# Patient Record
Sex: Male | Born: 2000 | Race: White | Hispanic: No | Marital: Single | Smoking: Never smoker
Health system: Southern US, Community
[De-identification: ages and names within clinical notes are randomized; demographics above are authoritative.]

## PROBLEM LIST (undated history)

## (undated) DIAGNOSIS — R519 Headache, unspecified: Secondary | ICD-10-CM

## (undated) DIAGNOSIS — F909 Attention-deficit hyperactivity disorder, unspecified type: Secondary | ICD-10-CM

## (undated) DIAGNOSIS — S82892D Other fracture of left lower leg, subsequent encounter for closed fracture with routine healing: Secondary | ICD-10-CM

## (undated) DIAGNOSIS — F429 Obsessive-compulsive disorder, unspecified: Secondary | ICD-10-CM

## (undated) DIAGNOSIS — K219 Gastro-esophageal reflux disease without esophagitis: Secondary | ICD-10-CM

## (undated) HISTORY — DX: Headache, unspecified: R51.9

## (undated) HISTORY — DX: Gastro-esophageal reflux disease without esophagitis: K21.9

## (undated) HISTORY — DX: Obsessive-compulsive disorder, unspecified: F42.9

## (undated) HISTORY — DX: Other fracture of left lower leg, subsequent encounter for closed fracture with routine healing: S82.892D

## (undated) HISTORY — DX: Attention-deficit hyperactivity disorder, unspecified type: F90.9

---
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
## 2021-01-19 ENCOUNTER — Other Ambulatory Visit: Payer: Self-pay | Admitting: Behavioral Health

## 2021-01-19 ENCOUNTER — Telehealth: Payer: Self-pay | Admitting: Behavioral Health

## 2021-01-19 NOTE — Progress Notes (Signed)
Patients mother called and spoke to staff advising that they self increased dosage of Intuniv to 5 mg from left over script. Patient advised that  Intuniv 4 mg ER is max dosage for this medication.  Patient should not ever increase dosage of medication without consulting first. Patient is scheduled for follow up July.

## 2021-01-19 NOTE — Telephone Encounter (Signed)
Please inform her that is not possible. 4 mg is max dosage for that medication and higher doses could not be safe. She should not be increasing dose without consulting.  We will have discuss next appt.

## 2021-01-19 NOTE — Telephone Encounter (Signed)
Pt's mom Harriett Sine called to report Pt was taking guanfacine 4 mg and wasn't quite enough. Had some of lesser dose from previous Rx. Pt took total of 5 mg and worked well. Requesting to send in new Rx for 5 mg to [facility]. Follow up apt is 7/19. Contact # [phone]   Pt continues to take Trazodone 50 mg @ night.

## 2021-01-20 NOTE — Telephone Encounter (Signed)
Called Pt's mom Harriett Sine back. LM on VM to return call to discuss information from BW

## 2021-01-21 NOTE — Telephone Encounter (Signed)
Harriett Sine returned call. Explained instructions about 4 mg is max dose. Higher dosed could not be safe . She understood and stated he will go back to 4 mg and discuss @ apt 7/19

## 2021-01-26 ENCOUNTER — Other Ambulatory Visit: Payer: Self-pay

## 2021-01-26 DIAGNOSIS — F902 Attention-deficit hyperactivity disorder, combined type: Secondary | ICD-10-CM

## 2021-01-26 MED ORDER — BUPROPION HCL ER (XL) 150 MG PO TB24: 150.0000 mg | ORAL_TABLET | Freq: Every day | ORAL | 0 refills | Status: DC

## 2021-02-01 ENCOUNTER — Other Ambulatory Visit: Payer: Self-pay

## 2021-02-01 ENCOUNTER — Encounter: Payer: Self-pay | Admitting: Behavioral Health

## 2021-02-01 ENCOUNTER — Ambulatory Visit (INDEPENDENT_AMBULATORY_CARE_PROVIDER_SITE_OTHER): Payer: 59 | Admitting: Behavioral Health

## 2021-02-01 VITALS — Wt 137.0 lb

## 2021-02-01 DIAGNOSIS — G4700 Insomnia, unspecified: Secondary | ICD-10-CM

## 2021-02-01 DIAGNOSIS — F411 Generalized anxiety disorder: Secondary | ICD-10-CM

## 2021-02-01 DIAGNOSIS — F902 Attention-deficit hyperactivity disorder, combined type: Secondary | ICD-10-CM

## 2021-02-01 DIAGNOSIS — F428 Other obsessive-compulsive disorder: Secondary | ICD-10-CM | POA: Diagnosis not present

## 2021-02-01 MED ORDER — MIRTAZAPINE 30 MG PO TABS: 30.0000 mg | ORAL_TABLET | Freq: Every day | ORAL | 1 refills | Status: DC

## 2021-02-01 MED ORDER — BUSPIRONE HCL 15 MG PO TABS: ORAL_TABLET | ORAL | 1 refills | Status: DC

## 2021-02-01 NOTE — Progress Notes (Signed)
[facility]  Patient ID: Marquell Saenz,  MRN: [id number]  PCP: Mickie Bail, MD  Date of Evaluation: 02/01/2021 Time spent:30 minutes  Chief Complaint:  Chief Complaint   Anxiety; Depression; ADHD; Follow-up; Medication Problem; Medication Refill     HISTORY/CURRENT STATUS: HPI 20 year old male patient presents to this office for follow up and medication management. His mother is present with his consent. He says that he feels like he has improved but also realized that he was blaming his anxiety on just ADHD. Says Wellbutrin has helped with anxiety and depression overall but he still struggles some. He says that he has been supplementing some Cannabis use to help with anxiety but realizes it may also work against him. He says that the Trazodone did not help much with sleep and so he smokes weed to go to sleep. Says he continues to lose weight and thinks it might be the type of weed he is using. He says that he wants help with anxiety so he can start cutting back on the Cannabis. He says his anxiety today is 6/10 and depression is 3/10.  He is getting 6-7 hours sleep broken sleep per night.  No mania, no psychosis. No SI/HI. Past medication failures: Concerta 27 mg and Ritalin 10 mg R tablets with possibly Focalin 15 mg ER  Individual Medical History/ Review of Systems: Changes? :No   Allergies: Patient has no known allergies.  Current Medications:  Current Outpatient Medications:    busPIRone (BUSPAR) 15 MG tablet, Take 1/3 tablet p.o. twice daily for 1 week, then take 2/3 tablet p.o. twice daily for 1 week, then take 1 tablet p.o. twice daily, Disp: 60 tablet, Rfl: 1   mirtazapine (REMERON) 30 MG tablet, Take 1 tablet (30 mg total) by mouth at bedtime., Disp: 30 tablet, Rfl: 1   buPROPion (WELLBUTRIN XL) 150 MG 24 hr tablet, Take 1 tablet (150 mg total) by mouth daily., Disp: 90 tablet, Rfl: 0   guanFACINE (INTUNIV) 4 MG TB24 ER tablet, Take 1 tablet (4 mg total) by  mouth daily., Disp: 30 tablet, Rfl: 1   GuanFACINE HCl 3 MG TB24, Take 1 tablet (3 mg total) by mouth at bedtime., Disp: 90 tablet, Rfl: 3 Medication Side Effects: none  Family Medical/ Social History: Changes? No  MENTAL HEALTH EXAM:  Weight 137 lb (62.1 kg).Body mass index is 18.07 kg/m.  General Appearance: Casual, Neat, and Well Groomed  Eye Contact:  Fair  Speech:  Clear and Coherent  Volume:  Normal  Mood:  Anxious  Affect:  Appropriate  Thought Process:  Coherent  Orientation:  Full (Time, Place, and Person)  Thought Content: Logical   Suicidal Thoughts:  No  Homicidal Thoughts:  No  Memory:  WNL  Judgement:  Fair  Insight:  Fair  Psychomotor Activity:  Increased  Concentration:  Concentration: Fair  Recall:  Fair  Fund of Knowledge: Good  Language: Good  Assets:  Desire for Improvement Physical Health  ADL's:  Intact  Cognition: WNL  Prognosis:  Good    DIAGNOSES:    ICD-10-CM   1. Attention deficit hyperactivity disorder (ADHD), combined type, moderate  F90.2 mirtazapine (REMERON) 30 MG tablet    2. Insomnia, unspecified type  G47.00 mirtazapine (REMERON) 30 MG tablet    3. Generalized anxiety disorder  F41.1 mirtazapine (REMERON) 30 MG tablet    busPIRone (BUSPAR) 15 MG tablet    4. Other obsessive-compulsive disorders  F42.8 mirtazapine (REMERON) 30 MG tablet  Receiving Psychotherapy: No    RECOMMENDATIONS:    Will continue Guanfacine 4 mg ER daily Discontinue Trazodone 50 mg at bedtime for sleep To start Remeron 30 mg tablet at bedtime for sleep and to assist in increasing appetite. Continue Wellbutrin 150 mg XL daily Start BuSpar 15 mg 1/3 tablet twice daily for 1 week, then increase to 2/3 tablet twice daily for 1 week, then increase to 1 tablet twice daily for anxiety.  To report any worsening symptoms and side effects promptly Agreed to follow up in 4 weeks to reassess. Greater than 50% of 30 min. face to face time with patient was  spent on counseling and coordination of care. We discussed taking medications properly and not skipping doses. Discussed patients continued weight loss and loss of appetite. We discussed possible unknown medication interference with Delta 8 and lacking research on the subject. Patient has recently switched back to regular Cannabis. Discussed with patient Cannabis being possible trigger for increased anxiety and irritability. Educated patient on how to organize medication to best remember taking on time. Instructed patient to check weights weekly.  E scribed medication to pharmacy.     Joan Flores, NP

## 2021-02-10 ENCOUNTER — Telehealth: Payer: Self-pay | Admitting: Behavioral Health

## 2021-02-10 NOTE — Telephone Encounter (Signed)
Patient's mother Harriett Sine) called in stating that they have a new pharmacy and would like prescriptions sent there instead. Ph: [phone]. Pharmacy [facility]. She provided the number to reach the pharmacist if needed. [phone]

## 2021-02-10 NOTE — Telephone Encounter (Signed)
Pharmacy updated.

## 2021-03-01 ENCOUNTER — Other Ambulatory Visit: Payer: Self-pay

## 2021-03-01 ENCOUNTER — Ambulatory Visit: Payer: 59 | Admitting: Behavioral Health

## 2021-03-01 ENCOUNTER — Encounter: Payer: Self-pay | Admitting: Behavioral Health

## 2021-03-01 ENCOUNTER — Ambulatory Visit (INDEPENDENT_AMBULATORY_CARE_PROVIDER_SITE_OTHER): Payer: 59 | Admitting: Behavioral Health

## 2021-03-01 VITALS — Wt 134.0 lb

## 2021-03-01 DIAGNOSIS — F411 Generalized anxiety disorder: Secondary | ICD-10-CM

## 2021-03-01 DIAGNOSIS — F902 Attention-deficit hyperactivity disorder, combined type: Secondary | ICD-10-CM

## 2021-03-01 DIAGNOSIS — F428 Other obsessive-compulsive disorder: Secondary | ICD-10-CM | POA: Diagnosis not present

## 2021-03-01 DIAGNOSIS — G4700 Insomnia, unspecified: Secondary | ICD-10-CM | POA: Diagnosis not present

## 2021-03-01 DIAGNOSIS — R634 Abnormal weight loss: Secondary | ICD-10-CM

## 2021-03-01 MED ORDER — MIRTAZAPINE 45 MG PO TABS: 45.0000 mg | ORAL_TABLET | Freq: Every day | ORAL | 1 refills | Status: DC

## 2021-03-01 NOTE — Progress Notes (Signed)
[facility]  Patient ID: Chris Salazar,  MRN: [id number]  PCP: Mickie Bail, MD  Date of Evaluation: 03/01/2021 Time spent:30 minutes  Chief Complaint:  Chief Complaint   Depression; Anxiety; Medication Reaction; Follow-up; Weight Loss     HISTORY/CURRENT STATUS: HPI 20 year old male presents to this office for follow up and medication management. He is accompanied by his mother with consent. He says that he feels like his anxiety and depression has improved since initiating the Buspar. He says his anxiety today is 3/10 and depression is 2/10. He is sleeping 8 hours plus per day. He says his concern today is continued loss of weight. Says he puts food up to his mouth but just does not have desire to eat. Says if he tries to eat, he sometimes just cant keep it down. Says he was about 155 pounds in Sept. 2021. He says he has had problems with acid reflux in the past. He has noticed some increase in appetite since starting the Remeron but just cant make himself eat. Says he does not have as much problem with liquids. Does not want to change his medication today for anxiety but would rather focus on his weight. No mania, no psychosis. No S/HI. Pt is requesting referral to gastroenterology.   Past medication failures: Concerta 27 mg and Ritalin 10 mg R tablets with possibly Focalin 15 mg ER     Individual Medical History/ Review of Systems: Changes? :No   Allergies: Patient has no known allergies.  Current Medications:  Current Outpatient Medications:    mirtazapine (REMERON) 45 MG tablet, Take 1 tablet (45 mg total) by mouth at bedtime., Disp: 30 tablet, Rfl: 1   buPROPion (WELLBUTRIN XL) 150 MG 24 hr tablet, Take 1 tablet (150 mg total) by mouth daily., Disp: 90 tablet, Rfl: 0   busPIRone (BUSPAR) 15 MG tablet, Take 1/3 tablet p.o. twice daily for 1 week, then take 2/3 tablet p.o. twice daily for 1 week, then take 1 tablet p.o. twice daily, Disp: 60 tablet, Rfl: 1    guanFACINE (INTUNIV) 4 MG TB24 ER tablet, Take 1 tablet (4 mg total) by mouth daily., Disp: 30 tablet, Rfl: 1   GuanFACINE HCl 3 MG TB24, Take 1 tablet (3 mg total) by mouth at bedtime., Disp: 90 tablet, Rfl: 3   mirtazapine (REMERON) 30 MG tablet, Take 1 tablet (30 mg total) by mouth at bedtime., Disp: 30 tablet, Rfl: 1 Medication Side Effects: none  Family Medical/ Social History: Changes? No  MENTAL HEALTH EXAM:  Weight 134 lb (60.8 kg).Body mass index is 17.68 kg/m.  General Appearance: Casual  Eye Contact:  Good  Speech:  Clear and Coherent  Volume:  Increased  Mood:  Angry  Affect:  Appropriate  Thought Process:  Coherent  Orientation:  Full (Time, Place, and Person)  Thought Content: Logical   Suicidal Thoughts:  No  Homicidal Thoughts:  No  Memory:  WNL  Judgement:  Good  Insight:  Good  Psychomotor Activity:  Normal  Concentration:  Concentration: Good  Recall:  Good  Fund of Knowledge: Good  Language: Good  Assets:  Desire for Improvement  ADL's:  Intact  Cognition: WNL  Prognosis:  Good    DIAGNOSES:    ICD-10-CM   1. Attention deficit hyperactivity disorder (ADHD), combined type, moderate  F90.2     2. Insomnia, unspecified type  G47.00 mirtazapine (REMERON) 45 MG tablet    3. Generalized anxiety disorder  F41.1  4. Other obsessive-compulsive disorders  F42.8 TSH    5. Loss of weight  R63.4 TSH    mirtazapine (REMERON) 45 MG tablet      Receiving Psychotherapy: No    RECOMMENDATIONS:    Will continue Guanfacine 3 mg ER daily To increase Remeron to 45 mg tablet at bedtime for sleep and to assist in increasing appetite. Continue Wellbutrin 150 mg XL daily Continue Buspar 15 mg tablet twice daily for total 30 mg.  To report any worsening symptoms and side effects promptly Agreed to follow up in 4 weeks to reassess. Greater than 50% of 30 min. face to face time with patient was spent on counseling and coordination of care. We discussed taking  medications properly and not skipping doses. Discussed patients continued weight loss and loss of appetite. We discussed possible unknown medication interference with Delta 8 and lacking research on the subject. Patient has recently switched back to regular Cannabis. Says he has decrease his use recently. Discussed with patient Cannabis being possible trigger for increased anxiety and irritability. Educated patient on how to organize medication to best remember taking on time. Instructed patient to check weights weekly.   Recommended patient follow up with Gastroenterology due to his continued weight loss over the last year.   Patient reports last year he was 155 lbs. Last visit 7/19, he was 137. Today he is 134.  I am not able to determine any psychological factor in appetite loss. Could be medication reaction but not able to ascertain at this time.   Labs placed for TSH today.  Recommended pt start supplementing with high protein shakes and not refrain from healthy carbohydrates.   Joan Flores, NP

## 2021-03-17 ENCOUNTER — Other Ambulatory Visit: Payer: Self-pay

## 2021-03-17 DIAGNOSIS — G4700 Insomnia, unspecified: Secondary | ICD-10-CM

## 2021-03-17 DIAGNOSIS — F411 Generalized anxiety disorder: Secondary | ICD-10-CM

## 2021-03-17 DIAGNOSIS — R634 Abnormal weight loss: Secondary | ICD-10-CM

## 2021-03-17 DIAGNOSIS — F902 Attention-deficit hyperactivity disorder, combined type: Secondary | ICD-10-CM

## 2021-03-17 MED ORDER — BUPROPION HCL ER (XL) 150 MG PO TB24: 150.0000 mg | ORAL_TABLET | Freq: Every day | ORAL | 0 refills | Status: DC

## 2021-03-17 MED ORDER — GUANFACINE HCL ER 3 MG PO TB24: 3.0000 mg | ORAL_TABLET | Freq: Every day | ORAL | 3 refills | Status: DC

## 2021-03-17 MED ORDER — MIRTAZAPINE 45 MG PO TABS: 45.0000 mg | ORAL_TABLET | Freq: Every day | ORAL | 0 refills | Status: DC

## 2021-03-17 MED ORDER — BUSPIRONE HCL 15 MG PO TABS: ORAL_TABLET | ORAL | 0 refills | Status: DC

## 2021-03-25 ENCOUNTER — Other Ambulatory Visit: Payer: Self-pay

## 2021-03-25 ENCOUNTER — Other Ambulatory Visit: Payer: Self-pay | Admitting: Behavioral Health

## 2021-03-25 ENCOUNTER — Telehealth: Payer: Self-pay | Admitting: Behavioral Health

## 2021-03-25 DIAGNOSIS — F411 Generalized anxiety disorder: Secondary | ICD-10-CM

## 2021-03-25 MED ORDER — BUSPIRONE HCL 15 MG PO TABS: ORAL_TABLET | ORAL | 0 refills | Status: DC

## 2021-03-25 NOTE — Telephone Encounter (Signed)
Patient's mother Harriett Sine) called in for refill on Buspirone 15mg . At last visit Chris Salazar was told to increase to two tablets a day. He is out of medication and needs refill sent to [facility] [address] [place], [place]. Ph: [phone]. Appt 9/13

## 2021-03-25 NOTE — Telephone Encounter (Signed)
Script sent to [facility]. Please advise her. Thank you.

## 2021-03-25 NOTE — Telephone Encounter (Signed)
I see you [facility] so I told her to call them and see if they will deliver.However,pt may need a week supply sent to local pharmacy while he waits for mail order

## 2021-03-28 ENCOUNTER — Other Ambulatory Visit: Payer: Self-pay | Admitting: Behavioral Health

## 2021-03-28 DIAGNOSIS — F411 Generalized anxiety disorder: Secondary | ICD-10-CM

## 2021-03-28 DIAGNOSIS — F428 Other obsessive-compulsive disorder: Secondary | ICD-10-CM

## 2021-03-28 DIAGNOSIS — F902 Attention-deficit hyperactivity disorder, combined type: Secondary | ICD-10-CM

## 2021-03-28 DIAGNOSIS — G4700 Insomnia, unspecified: Secondary | ICD-10-CM

## 2021-03-29 ENCOUNTER — Ambulatory Visit: Payer: 59 | Admitting: Behavioral Health

## 2021-03-31 ENCOUNTER — Encounter: Payer: Self-pay | Admitting: Behavioral Health

## 2021-03-31 ENCOUNTER — Other Ambulatory Visit: Payer: Self-pay

## 2021-03-31 ENCOUNTER — Ambulatory Visit (INDEPENDENT_AMBULATORY_CARE_PROVIDER_SITE_OTHER): Payer: 59 | Admitting: Behavioral Health

## 2021-03-31 VITALS — Wt 144.0 lb

## 2021-03-31 DIAGNOSIS — F411 Generalized anxiety disorder: Secondary | ICD-10-CM | POA: Diagnosis not present

## 2021-03-31 DIAGNOSIS — F902 Attention-deficit hyperactivity disorder, combined type: Secondary | ICD-10-CM | POA: Diagnosis not present

## 2021-03-31 DIAGNOSIS — F428 Other obsessive-compulsive disorder: Secondary | ICD-10-CM | POA: Diagnosis not present

## 2021-03-31 DIAGNOSIS — F99 Mental disorder, not otherwise specified: Secondary | ICD-10-CM

## 2021-03-31 DIAGNOSIS — R634 Abnormal weight loss: Secondary | ICD-10-CM

## 2021-03-31 DIAGNOSIS — G4700 Insomnia, unspecified: Secondary | ICD-10-CM

## 2021-03-31 DIAGNOSIS — F5105 Insomnia due to other mental disorder: Secondary | ICD-10-CM

## 2021-03-31 MED ORDER — JORNAY PM 20 MG PO CP24: ORAL_CAPSULE | ORAL | 0 refills | Status: DC

## 2021-03-31 NOTE — Progress Notes (Signed)
[facility]  Patient ID: Chris Salazar,  MRN: [id number]  PCP: Mickie Bail, MD  Date of Evaluation: 03/31/2021 Time spent:40 minutes  Chief Complaint:  Chief Complaint   Anxiety; ADHD; Follow-up; Medication Refill; Medication Problem     HISTORY/CURRENT STATUS: HPI 20 year old male presents to this office for follow up and medication management. He is accompanied by his mother with consent. He says that he feels like his anxiety and depression has improved since initiating the Buspar. He says his anxiety today is 2/10 and depression is 2/10. He is sleeping 8 hours plus per day. He has gained 10 lbs since last visit. Says he was about 155 pounds in Sept. 2021. He is having problems with classes at school and not able to accomplish task. Says that he dropped two classes. He has not been taking his Guanfacine and would like to try another medication for his ADHD. He says he has had problems with acid reflux in the past. He has noticed some increase in appetite since starting the Remeron but just cant make himself eat. Says he does not have as much problem with liquids. Does not want to change his medication today for anxiety but would rather focus on his weight. No mania, no psychosis. No S/HI. Pt is requesting referral to gastroenterology.    Past medication failures: Concerta 27 mg and Ritalin 10 mg R tablets with possibly Focalin 15 mg ER         Individual Medical History/ Review of Systems: Changes? :No   Allergies: Patient has no known allergies.  Current Medications:  Current Outpatient Medications:    Methylphenidate HCl ER, PM, (JORNAY PM) 20 MG CP24, Take in the evening at 8 pm daily., Disp: 30 capsule, Rfl: 0   buPROPion (WELLBUTRIN XL) 150 MG 24 hr tablet, Take 1 tablet (150 mg total) by mouth daily., Disp: 90 tablet, Rfl: 0   busPIRone (BUSPAR) 15 MG tablet, Take 1 tablet (15 mg) by mouth twice a day, Disp: 60 tablet, Rfl: 0   GuanFACINE HCl 3 MG TB24,  Take 1 tablet (3 mg total) by mouth at bedtime., Disp: 90 tablet, Rfl: 3   mirtazapine (REMERON) 45 MG tablet, Take 1 tablet (45 mg total) by mouth at bedtime., Disp: 90 tablet, Rfl: 0 Medication Side Effects: none  Family Medical/ Social History: Changes? No  MENTAL HEALTH EXAM:  There were no vitals taken for this visit.There is no height or weight on file to calculate BMI.  General Appearance: Casual, Neat, and Well Groomed  Eye Contact:  Good  Speech:  Clear and Coherent  Volume:  Normal  Mood:  Anxious  Affect:  Appropriate  Thought Process:  Coherent  Orientation:  Full (Time, Place, and Person)  Thought Content: Logical   Suicidal Thoughts:  No  Homicidal Thoughts:  No  Memory:  WNL  Judgement:  Good  Insight:  Good  Psychomotor Activity:  Increased  Concentration:  Concentration: Fair  Recall:  [employer] of Knowledge: Fair  Language: Good  Assets:  Desire for Improvement  ADL's:  Intact  Cognition: WNL  Prognosis:  Good    DIAGNOSES:    ICD-10-CM   1. Generalized anxiety disorder  F41.1 Methylphenidate HCl ER, PM, (JORNAY PM) 20 MG CP24    2. Attention deficit hyperactivity disorder (ADHD), combined type, moderate  F90.2 Methylphenidate HCl ER, PM, (JORNAY PM) 20 MG CP24    3. Insomnia, unspecified type  G47.00     4.  Other obsessive-compulsive disorders  F42.8     5. Loss of weight  R63.4       Receiving Psychotherapy: No    RECOMMENDATIONS:  Stop Guanfacine 3 mg ER daily. Patient has not been taking To add Jornay 20 mg at 8 pm at night.  To increase Remeron to 45 mg tablet at bedtime for sleep and to assist in increasing appetite. Continue Wellbutrin 150 mg XL daily Continue Buspar 15 mg tablet twice daily for total 30 mg.  To report any worsening symptoms and side effects promptly Agreed to follow up in 4 weeks to reassess. Greater than 50% of 30 min. face to face time with patient was spent on counseling and coordination of care. We discussed  taking medications properly and not skipping doses. Patient had recently gained 10 lbs since last visit. Patient has recently switched back to regular Cannabis. Smoking daily at bedtime. Discussed with patient Cannabis being possible trigger for increased anxiety and irritability. Educated patient on how to organize medication to best remember taking on time. Instructed patient to check weights weekly.  Discussed his lack of drive and motivation and struggles in school. Pt says that he wants to get some life coaching and will be applying for assistance at school. He will be requesting  letter to provide to school admin documenting care.    Has upcoming appt with gastroenterology pending.      Joan Flores, NP

## 2021-04-29 ENCOUNTER — Other Ambulatory Visit: Payer: Self-pay | Admitting: Behavioral Health

## 2021-04-29 DIAGNOSIS — F902 Attention-deficit hyperactivity disorder, combined type: Secondary | ICD-10-CM

## 2021-05-02 ENCOUNTER — Ambulatory Visit: Payer: 59 | Admitting: Behavioral Health

## 2021-05-05 ENCOUNTER — Ambulatory Visit: Payer: 59 | Admitting: Behavioral Health

## 2021-05-12 ENCOUNTER — Other Ambulatory Visit: Payer: Self-pay

## 2021-05-12 ENCOUNTER — Ambulatory Visit (INDEPENDENT_AMBULATORY_CARE_PROVIDER_SITE_OTHER): Payer: 59 | Admitting: Behavioral Health

## 2021-05-12 ENCOUNTER — Encounter: Payer: Self-pay | Admitting: Behavioral Health

## 2021-05-12 DIAGNOSIS — R634 Abnormal weight loss: Secondary | ICD-10-CM | POA: Diagnosis not present

## 2021-05-12 DIAGNOSIS — F411 Generalized anxiety disorder: Secondary | ICD-10-CM

## 2021-05-12 DIAGNOSIS — F428 Other obsessive-compulsive disorder: Secondary | ICD-10-CM

## 2021-05-12 DIAGNOSIS — F5105 Insomnia due to other mental disorder: Secondary | ICD-10-CM

## 2021-05-12 DIAGNOSIS — G4700 Insomnia, unspecified: Secondary | ICD-10-CM

## 2021-05-12 DIAGNOSIS — F99 Mental disorder, not otherwise specified: Secondary | ICD-10-CM

## 2021-05-12 DIAGNOSIS — F902 Attention-deficit hyperactivity disorder, combined type: Secondary | ICD-10-CM | POA: Diagnosis not present

## 2021-05-12 MED ORDER — JORNAY PM 20 MG PO CP24: ORAL_CAPSULE | ORAL | 0 refills | Status: DC

## 2021-05-12 MED ORDER — BUPROPION HCL ER (XL) 150 MG PO TB24: ORAL_TABLET | ORAL | 0 refills | Status: DC

## 2021-05-12 MED ORDER — MIRTAZAPINE 45 MG PO TABS: 45.0000 mg | ORAL_TABLET | Freq: Every day | ORAL | 3 refills | Status: DC

## 2021-05-12 NOTE — Progress Notes (Signed)
[facility]  Patient ID: Chris Salazar,  MRN: [id number]  PCP: Mickie Bail, MD  Date of Evaluation: 05/12/2021 Time spent:30 minutes  Chief Complaint:   HISTORY/CURRENT STATUS: HPI 20 year old male presents to this office for follow up and medication management. He is accompanied by his mother with consent. He says that he feels like his anxiety and depression has improved. Says he no longer need or is taking the Buspar. Says the [employer] is working very well with him for ADHD. Says he feels more calm but alert. He says his anxiety today is 1/10 and depression is 0/10. He is sleeping 8 hours plus per day.His weight has stabilized. He will be attending [employer] spring semester. Says he is ready for more independency from his parents. He says he has stopped dabbing cannabis in vape pen and only smokes the flower which is not as strong. Says he has cut back 50%. He will be getting life coaching at [employer] this semester at school. No mania, no psychosis. No S/HI.      Past medication trials: Concerta 27 mg and Ritalin 10 mg R tablets with possibly Focalin 15 mg ER, Buspar      Individual Medical History/ Review of Systems: Changes? :No   Allergies: Patient has no known allergies.  Current Medications:  Current Outpatient Medications:    buPROPion (WELLBUTRIN XL) 150 MG 24 hr tablet, TAKE 1 TABLET(150 MG) BY MOUTH DAILY, Disp: 90 tablet, Rfl: 0   busPIRone (BUSPAR) 15 MG tablet, Take 1 tablet (15 mg) by mouth twice a day, Disp: 60 tablet, Rfl: 0   GuanFACINE HCl 3 MG TB24, Take 1 tablet (3 mg total) by mouth at bedtime., Disp: 90 tablet, Rfl: 3   Methylphenidate HCl ER, PM, (JORNAY PM) 20 MG CP24, Take in the evening at 8 pm daily., Disp: 30 capsule, Rfl: 0   mirtazapine (REMERON) 45 MG tablet, Take 1 tablet (45 mg total) by mouth at bedtime., Disp: 90 tablet, Rfl: 0 Medication Side Effects: none  Family Medical/ Social History: Changes? No  MENTAL HEALTH  EXAM:  There were no vitals taken for this visit.There is no height or weight on file to calculate BMI.  General Appearance: Casual, Neat, and Well Groomed  Eye Contact:  Good  Speech:  Clear and Coherent  Volume:  Normal  Mood:  Angry  Affect:  Appropriate  Thought Process:  Coherent  Orientation:  Full (Time, Place, and Person)  Thought Content: Logical   Suicidal Thoughts:  No  Homicidal Thoughts:  No  Memory:  WNL  Judgement:  Good  Insight:  Good  Psychomotor Activity:  Normal  Concentration:  Concentration: Good  Recall:  Good  Fund of Knowledge: Good  Language: Good  Assets:  Desire for Improvement  ADL's:  Intact  Cognition: WNL  Prognosis:  Good    DIAGNOSES: No diagnosis found.  Receiving Psychotherapy: Yes    RECOMMENDATIONS:   To add Jornay 20 mg at 8 pm at night.  To continue Remeron to 45 mg tablet at bedtime for sleep and to assist in increasing appetite. Continue Wellbutrin 150 mg XL daily Pt Stopped the Buspar. To report any worsening symptoms and side effects promptly Agreed to follow up in 4 weeks to reassess. Greater than 50% of 30 min. face to face time with patient was spent on counseling and coordination of care. We discussed taking medications properly and not skipping doses. Patient had recently gained 10 lbs since last visit.  Patient has recently switched back to regular Cannabis. Smoking daily at bedtime. Discussed with patient Cannabis being possible trigger for increased anxiety and irritability. Educated patient on how to organize medication to best remember taking on time. Instructed patient to check weights weekly.  Discussed his lack of drive and motivation and struggles in school. He has meeting with school counselor next week for life coaching. Recommendation from this office was received. He was accepted into [employer] and will be attending spring semester.      Joan Flores, NP

## 2021-05-30 ENCOUNTER — Other Ambulatory Visit: Payer: Self-pay | Admitting: Behavioral Health

## 2021-05-30 DIAGNOSIS — R634 Abnormal weight loss: Secondary | ICD-10-CM

## 2021-05-30 DIAGNOSIS — G4700 Insomnia, unspecified: Secondary | ICD-10-CM

## 2021-06-10 ENCOUNTER — Other Ambulatory Visit: Payer: Self-pay | Admitting: Behavioral Health

## 2021-06-10 DIAGNOSIS — F902 Attention-deficit hyperactivity disorder, combined type: Secondary | ICD-10-CM

## 2021-07-20 ENCOUNTER — Ambulatory Visit: Payer: 59 | Admitting: Behavioral Health

## 2021-07-20 ENCOUNTER — Other Ambulatory Visit: Payer: Self-pay

## 2021-07-20 ENCOUNTER — Encounter: Payer: Self-pay | Admitting: Behavioral Health

## 2021-07-20 DIAGNOSIS — F428 Other obsessive-compulsive disorder: Secondary | ICD-10-CM | POA: Diagnosis not present

## 2021-07-20 DIAGNOSIS — F99 Mental disorder, not otherwise specified: Secondary | ICD-10-CM

## 2021-07-20 DIAGNOSIS — F411 Generalized anxiety disorder: Secondary | ICD-10-CM

## 2021-07-20 DIAGNOSIS — F902 Attention-deficit hyperactivity disorder, combined type: Secondary | ICD-10-CM | POA: Diagnosis not present

## 2021-07-20 DIAGNOSIS — R634 Abnormal weight loss: Secondary | ICD-10-CM | POA: Diagnosis not present

## 2021-07-20 DIAGNOSIS — F5105 Insomnia due to other mental disorder: Secondary | ICD-10-CM

## 2021-07-20 MED ORDER — SERTRALINE HCL 50 MG PO TABS: ORAL_TABLET | ORAL | 1 refills | Status: DC

## 2021-07-20 NOTE — Progress Notes (Signed)
[facility]  Patient ID: BOE GOFFREDO,  MRN: [id number]  PCP: Langley Gauss, MD  Date of Evaluation: 07/20/2021 Time spent:20 minutes  Chief Complaint:   HISTORY/CURRENT STATUS: HPI  21 year old male presents to this office for follow up and medication management. He is accompanied by his mother with consent. He says that he has not been taking any of his medications because he briefly improved but says none of the medications were effective anyway. Today he says he anxiety is worse and that he feels it is contributing to his ADHD problems. He and his mother are requesting medication to help with his anxiety which is severe as indicated also by GAD 7.  Last visit reported minimal anxiety and depression. Reports his anxiety today at  9/10 and his depression 3/10.  He is sleeping 7 hours plus per day.His weight has stabilized. He will be attending [employer] spring semester. Says he is ready for more independency from his parents. He says he has stopped dabbing cannabis in vape pen and only smokes the flower which is not as strong. Says he has cut back 50%.  No mania, no psychosis. No S/HI.     Past medication trials: Concerta 27 mg and Ritalin 10 mg R tablets with possibly Focalin 15 mg ER, Buspar   Individual Medical History/ Review of Systems: Changes? :No   Allergies: Patient has no known allergies.  Current Medications:  Current Outpatient Medications:    buPROPion (WELLBUTRIN XL) 150 MG 24 hr tablet, TAKE 1 TABLET(150 MG) BY MOUTH DAILY, Disp: 90 tablet, Rfl: 0   Methylphenidate HCl ER, PM, (JORNAY PM) 20 MG CP24, Take in the evening at 8 pm daily., Disp: 30 capsule, Rfl: 0   mirtazapine (REMERON) 45 MG tablet, TAKE 1 TABLET BY MOUTH AT  BEDTIME, Disp: 90 tablet, Rfl: 0 Medication Side Effects: none  Family Medical/ Social History: Changes? No  MENTAL HEALTH EXAM:  There were no vitals taken for this visit.There is no height or weight on file to calculate BMI.   General Appearance: Casual  Eye Contact:  Good  Speech:  Clear and Coherent  Volume:  Normal  Mood:  Angry  Affect:  Appropriate  Thought Process:  Coherent  Orientation:  Full (Time, Place, and Person)  Thought Content: Logical   Suicidal Thoughts:  No  Homicidal Thoughts:  No  Memory:  WNL  Judgement:  Good  Insight:  Good  Psychomotor Activity:  Normal  Concentration:  Concentration: Good  Recall:  Good  Fund of Knowledge: Good  Language: Good  Assets:  Desire for Improvement  ADL's:  Intact  Cognition: WNL  Prognosis:  Good    DIAGNOSES:    ICD-10-CM   1. Generalized anxiety disorder  F41.1     2. Attention deficit hyperactivity disorder (ADHD), combined type, moderate  F90.2     3. Other obsessive-compulsive disorders  F42.8     4. Loss of weight  R63.4     5. Insomnia due to other mental disorder  F51.05    F99       Receiving Psychotherapy: Yes    RECOMMENDATIONS:   Pt stopped taking these medication without consulting this office He stopped taking [place] He stopped taking Remeron He stopped taking Wellbutrin Pt Stopped the Buspar.  Will start Zoloft 25 mg for 7  days, then 50 mg daily until next f/u.  To report any worsening symptoms and side effects promptly Agreed to follow up in 4  weeks to reassess. Greater than 50% of 30 min. face to face time with patient was spent on counseling and coordination of care. We discussed taking medications properly and not skipping doses.  Patient has recently switched back to regular Cannabis. Smoking daily at bedtime. Discussed with patient Cannabis being possible trigger for increased anxiety and irritability. Educated patient on how to organize medication to best remember taking on time. Instructed patient to check weights weekly.  Discussed his lack of drive and motivation and struggles in school. He believes his performance and motivation will improve once he is out of his parents house.  He was accepted into  [employer] and will be attending spring semester.       Elwanda Brooklyn, NP

## 2021-08-03 ENCOUNTER — Telehealth: Payer: Self-pay | Admitting: Behavioral Health

## 2021-08-03 ENCOUNTER — Other Ambulatory Visit: Payer: Self-pay | Admitting: Behavioral Health

## 2021-08-03 DIAGNOSIS — F411 Generalized anxiety disorder: Secondary | ICD-10-CM

## 2021-08-03 DIAGNOSIS — F428 Other obsessive-compulsive disorder: Secondary | ICD-10-CM

## 2021-08-03 MED ORDER — SERTRALINE HCL 100 MG PO TABS: 100.0000 mg | ORAL_TABLET | Freq: Every day | ORAL | 1 refills | Status: AC

## 2021-08-03 NOTE — Telephone Encounter (Signed)
See phone message. Called patient and he said he feels like the sertraline is working, it just isn't lasting long enough. He says early afternoon he "feels it leaves my body." He is asking for a stronger dose or a BID dosing. He is taking 50 mg qd and is not having any SE.

## 2021-08-03 NOTE — Telephone Encounter (Signed)
Call him and tell him we will increase to 100 mg daily. He can take two Sertraline 50 mg tablets for now until he runs out. I will send in a new script for Sertraline 100 mg tablet daily. Sent to [facility]. He should have enough until he gets new script.

## 2021-08-03 NOTE — Telephone Encounter (Signed)
Pt LVM stating he would like to increase his meds.  The effects of the medicine seems to last 3-4 hours and he wants something that last longer.  Next appt 2/1

## 2021-08-03 NOTE — Telephone Encounter (Signed)
Notified patient of recommendations.

## 2021-08-10 ENCOUNTER — Encounter: Payer: Self-pay | Admitting: Behavioral Health

## 2021-08-17 ENCOUNTER — Encounter: Payer: Self-pay | Admitting: Behavioral Health

## 2021-08-17 ENCOUNTER — Other Ambulatory Visit: Payer: Self-pay

## 2021-08-17 ENCOUNTER — Ambulatory Visit: Payer: 59 | Admitting: Behavioral Health

## 2021-08-17 DIAGNOSIS — F428 Other obsessive-compulsive disorder: Secondary | ICD-10-CM

## 2021-08-17 DIAGNOSIS — F411 Generalized anxiety disorder: Secondary | ICD-10-CM

## 2021-08-17 MED ORDER — SERTRALINE HCL 100 MG PO TABS: 150.0000 mg | ORAL_TABLET | Freq: Every day | ORAL | 3 refills | Status: DC

## 2021-08-17 NOTE — Progress Notes (Signed)
[facility]  Patient ID: KYLIE LOTTI,  MRN: [id number]  PCP: Langley Gauss, MD  Date of Evaluation: 08/17/2021 Time spent:30 minutes  Chief Complaint:  Chief Complaint   Anxiety; Depression; ADHD; Follow-up; Medication Refill     HISTORY/CURRENT STATUS: HPI 21 year old male presents to this office for follow up and medication management. He is accompanied by his mother with consent. Says that he is very happy since taking the Zoloft. Says he has a sense of calm and the medication has improved irritability, anxiety and depression levels. Reports his anxiety today at  3/10, last visit 9/10. and his depression 2/10. He is requesting dosage increase with Zoloft in belief it may continue to improve his symptoms.  He is sleeping 7 hours plus per day.His weight has stabilized. Says he is adjusting well at [employer]. He has roommate off campus. Says he feels like this is where he is supposed to be.  He says he has stopped dabbing cannabis in vape pen and only smokes the flower which is not as strong. Says he has cut back 50%. Only smokes at bedtime now.  No mania, no psychosis. No S/HI.    Past medication trials: Concerta 27 mg and Ritalin 10 mg R tablets with possibly Focalin 15 mg ER, Buspar          Individual Medical History/ Review of Systems: Changes? :No   Allergies: Patient has no known allergies.  Current Medications:  Current Outpatient Medications:    sertraline (ZOLOFT) 100 MG tablet, Take 1.5 tablets (150 mg total) by mouth daily., Disp: 45 tablet, Rfl: 3   mirtazapine (REMERON) 45 MG tablet, TAKE 1 TABLET BY MOUTH AT  BEDTIME, Disp: 90 tablet, Rfl: 0   sertraline (ZOLOFT) 100 MG tablet, Take 1 tablet (100 mg total) by mouth daily., Disp: 100 tablet, Rfl: 1 Medication Side Effects: none  Family Medical/ Social History: Changes? No  MENTAL HEALTH EXAM:  There were no vitals taken for this visit.There is no height or weight on file to calculate BMI.   General Appearance: Casual and Neat  Eye Contact:  Good  Speech:  Clear and Coherent  Volume:  Normal  Mood:  Anxious  Affect:  Anxious  Thought Process:  Coherent  Orientation:  Full (Time, Place, and Person)  Thought Content: Logical   Suicidal Thoughts:  No  Homicidal Thoughts:  No  Memory:  WNL  Judgement:  Good  Insight:  Good  Psychomotor Activity:  Normal  Concentration:  Concentration: Good  Recall:  Good  Fund of Knowledge: Good  Language: Good  Assets:  Desire for Improvement  ADL's:  Intact  Cognition: WNL  Prognosis:  Good    DIAGNOSES:    ICD-10-CM   1. Generalized anxiety disorder  F41.1 sertraline (ZOLOFT) 100 MG tablet    2. Other obsessive-compulsive disorders  F42.8 sertraline (ZOLOFT) 100 MG tablet      Receiving Psychotherapy: yes   RECOMMENDATIONS:    Will start Zoloft 25 mg for 7  days, then 50 mg daily until next f/u.   To report any worsening symptoms and side effects promptly Agreed to follow up in 3 months to reassess.Will call sooner if problems arise. He has started college recently at [employer].  Provided emergency contact information   Greater than 50% of 30 min. face to face time with patient was spent on counseling and coordination of care. Discussed his recent improvement with anxiety and depression. Says the Zoloft has been  helping him by giving a sense of calm. With his moderate improvements, he is requesting dosage increase. He believes this may continue to help him get to a more satisfactory place mentally.  We discussed taking medications properly and not skipping doses.  Patient has recently switched back to regular Cannabis. Smoking daily at bedtime. Reinforce with patient Cannabis being possible trigger for increased anxiety and irritability. Enforced him continuing to try to cut back on use.  Educated patient on how to organize medication to best remember taking on time. Instructed patient to check weights weekly.              Elwanda Brooklyn, NP

## 2021-11-14 ENCOUNTER — Ambulatory Visit: Payer: 59 | Admitting: Behavioral Health

## 2021-12-07 ENCOUNTER — Ambulatory Visit: Payer: 59 | Admitting: Behavioral Health

## 2021-12-08 ENCOUNTER — Ambulatory Visit (INDEPENDENT_AMBULATORY_CARE_PROVIDER_SITE_OTHER): Payer: 59 | Admitting: Behavioral Health

## 2021-12-08 ENCOUNTER — Encounter: Payer: Self-pay | Admitting: Behavioral Health

## 2021-12-08 DIAGNOSIS — F428 Other obsessive-compulsive disorder: Secondary | ICD-10-CM | POA: Diagnosis not present

## 2021-12-08 DIAGNOSIS — F411 Generalized anxiety disorder: Secondary | ICD-10-CM | POA: Diagnosis not present

## 2021-12-08 MED ORDER — SERTRALINE HCL 100 MG PO TABS: 150.0000 mg | ORAL_TABLET | Freq: Every day | ORAL | 3 refills | Status: AC

## 2021-12-08 NOTE — Progress Notes (Signed)
[facility]  Patient ID: Chris Salazar,  MRN: [id number]  PCP: Mickie Bail, MD  Date of Evaluation: 12/08/2021 Time spent:20 minutes  Chief Complaint:  Chief Complaint   Anxiety; Depression; Follow-up; Medication Refill     HISTORY/CURRENT STATUS: HPI  Individual Medical History/ Review of Systems: Changes? :No   Allergies: Patient has no known allergies.  Current Medications:  Current Outpatient Medications:    mirtazapine (REMERON) 45 MG tablet, TAKE 1 TABLET BY MOUTH AT  BEDTIME, Disp: 90 tablet, Rfl: 0   sertraline (ZOLOFT) 100 MG tablet, Take 1 tablet (100 mg total) by mouth daily., Disp: 100 tablet, Rfl: 1   sertraline (ZOLOFT) 100 MG tablet, Take 1.5 tablets (150 mg total) by mouth daily., Disp: 45 tablet, Rfl: 3 Medication Side Effects: none  Family Medical/ Social History: Changes? No  MENTAL HEALTH EXAM:  There were no vitals taken for this visit.There is no height or weight on file to calculate BMI.  General Appearance: Casual, Neat, and Well Groomed  Eye Contact:  Good  Speech:  Clear and Coherent  Volume:  Normal  Mood:  NA  Affect:  Appropriate  Thought Process:  Disorganized  Orientation:  Full (Time, Place, and Person)  Thought Content: Logical   Suicidal Thoughts:  No  Homicidal Thoughts:  No  Memory:  WNL  Judgement:  Good  Insight:  Good  Psychomotor Activity:  Normal  Concentration:  Concentration: Good  Recall:  Good  Fund of Knowledge: Good  Language: Good  Assets:  Desire for Improvement  ADL's:  Intact  Cognition: WNL  Prognosis:  Good    DIAGNOSES:    ICD-10-CM   1. Generalized anxiety disorder  F41.1 sertraline (ZOLOFT) 100 MG tablet    2. Other obsessive-compulsive disorders  F42.8 sertraline (ZOLOFT) 100 MG tablet      Receiving Psychotherapy: No    RECOMMENDATIONS:   To continue Zoloft 150 mg daily.   To report any worsening symptoms and side effects promptly Agreed to follow up in 6 months to  reassess. Will call sooner if problems arise. He will be living at [employer] attending college there.  Provided emergency contact information     Greater than 50% of 20  min. face to face time with patient was spent on counseling and coordination of care. Discussed his recent improvement with anxiety and depression. Says the Zoloft has been helping him by giving a sense of calm. With his moderate improvements, he is requesting dosage increase. He believes this may continue to help him get to a more satisfactory place mentally.  We discussed taking medications properly and not skipping doses.  Patient has recently switched back to regular Cannabis. Smoking daily at bedtime. Reinforce with patient Cannabis being possible trigger for increased anxiety and irritability. Enforced him continuing to try to cut back on use.  Educated patient on how to organize medication to best remember taking on time. Instructed patient to check weights weekly.    Joan Flores, NP

## 2022-07-20 ENCOUNTER — Ambulatory Visit (INDEPENDENT_AMBULATORY_CARE_PROVIDER_SITE_OTHER): Payer: 59 | Admitting: Behavioral Health

## 2022-07-20 ENCOUNTER — Encounter: Payer: Self-pay | Admitting: Behavioral Health

## 2022-07-20 DIAGNOSIS — F99 Mental disorder, not otherwise specified: Secondary | ICD-10-CM

## 2022-07-20 DIAGNOSIS — F411 Generalized anxiety disorder: Secondary | ICD-10-CM | POA: Diagnosis not present

## 2022-07-20 DIAGNOSIS — R634 Abnormal weight loss: Secondary | ICD-10-CM

## 2022-07-20 DIAGNOSIS — F5105 Insomnia due to other mental disorder: Secondary | ICD-10-CM

## 2022-07-20 DIAGNOSIS — F902 Attention-deficit hyperactivity disorder, combined type: Secondary | ICD-10-CM

## 2022-07-20 MED ORDER — AMPHETAMINE-DEXTROAMPHET ER 10 MG PO CP24: 10.0000 mg | ORAL_CAPSULE | Freq: Every day | ORAL | 0 refills | Status: DC

## 2022-07-20 MED ORDER — MIRTAZAPINE 30 MG PO TABS: 30.0000 mg | ORAL_TABLET | Freq: Every day | ORAL | 3 refills | Status: DC

## 2022-07-20 NOTE — Progress Notes (Signed)
[facility]  Patient ID: Chris Salazar,  MRN: [id number]  PCP: Langley Gauss, MD (Inactive)  Date of Evaluation: 07/20/2022 Time spent:30 minutes  Chief Complaint:  Chief Complaint   Depression; Anxiety; Follow-up; Medication Refill; Medication Problem; Patient Education; ADHD     HISTORY/CURRENT STATUS: HPI  22  year old male presents to this office for follow up and medication management. He has not been taking any of his medication. Says his problem now is lack of attention and focus. He was on ADHD medication through his early to mid teen years. He feels like he needs to be back on medication. Made his first D in school. Says he can sit still very long to study or read.  Reports his anxiety today at  1/10, and his depression 1/10.  He is sleeping 7 hours plus per day.His weight has stabilized. Says has adjusted well at [facility]. He has roommate off campus. Says he feels like this is where he is supposed to be.  He says he has stopped dabbing cannabis in vape pen and only smokes the flower which is not as strong. Says he has cut back 50%. Only smokes at bedtime now.  No mania, no psychosis. No S/HI.    Past medication trials: Concerta 27 mg and Ritalin 10 mg R tablets with possibly Focalin 15 mg ER, Buspar    Individual Medical History/ Review of Systems: Changes? :No   Allergies: Patient has no known allergies.  Current Medications:  Current Outpatient Medications:    amphetamine-dextroamphetamine (ADDERALL XR) 10 MG 24 hr capsule, Take 1 capsule (10 mg total) by mouth daily., Disp: 30 capsule, Rfl: 0   mirtazapine (REMERON) 30 MG tablet, Take 1 tablet (30 mg total) by mouth at bedtime., Disp: 30 tablet, Rfl: 3   sertraline (ZOLOFT) 100 MG tablet, Take 1 tablet (100 mg total) by mouth daily. (Patient not taking: Reported on 07/20/2022), Disp: 100 tablet, Rfl: 1   sertraline (ZOLOFT) 100 MG tablet, Take 1.5 tablets (150 mg total) by mouth daily. (Patient not  taking: Reported on 07/20/2022), Disp: 45 tablet, Rfl: 3 Medication Side Effects: none  Family Medical/ Social History: Changes? No  MENTAL HEALTH EXAM:  There were no vitals taken for this visit.There is no height or weight on file to calculate BMI.  General Appearance: Casual, Neat, and Well Groomed  Eye Contact:  Good  Speech:  Clear and Coherent  Volume:  Normal  Mood:  NA  Affect:  Congruent  Thought Process:  Coherent  Orientation:  Full (Time, Place, and Person)  Thought Content: Logical   Suicidal Thoughts:  No  Homicidal Thoughts:  No  Memory:  WNL  Judgement:  Good  Insight:  Good  Psychomotor Activity:  Normal  Concentration:  Concentration: Good  Recall:  Good  Fund of Knowledge: Good  Language: Good  Assets:  Desire for Improvement  ADL's:  Intact  Cognition: WNL  Prognosis:  Good    DIAGNOSES:    ICD-10-CM   1. Attention deficit hyperactivity disorder (ADHD), combined type, moderate  F90.2 amphetamine-dextroamphetamine (ADDERALL XR) 10 MG 24 hr capsule    mirtazapine (REMERON) 30 MG tablet    2. Loss of weight  R63.4     3. Generalized anxiety disorder  F41.1     4. Insomnia due to other mental disorder  F51.05    F99       Receiving Psychotherapy: No    RECOMMENDATIONS:   Greater than 50% of 20  min. face to face time with patient was spent on counseling and coordination of care. Discussed his recent improvement with anxiety and depression. He has stopped taking Mirtazapine and Zoloft.  We discussed his continued problems with attention and focus. He made his first D in school.  We discussed previous medication prescribed for ADHD and medication options. Says he has a hard time studying. Instructed patient to check weights weekly.   To start Adderall 10 mg ER daily after breakfast To restart Mirtazapine 30 mg at bedtime daily To report any worsening symptoms and side effects promptly Agreed to follow up in 4 weeks to reassess. Will call sooner if  problems arise. He will be living at [employer] attending college there. \ Requesting Vid visit next appt Discussed potential benefits, risks, and side effects of stimulants with patient to include increased heart rate, palpitations, insomnia, increased anxiety, increased irritability, or decreased appetite.  Instructed patient to contact office if experiencing any significant tolerability issues.   Provided emergency contact information   Reviewed PDMP  Elwanda Brooklyn, NP

## 2022-07-21 ENCOUNTER — Other Ambulatory Visit: Payer: Self-pay

## 2022-07-21 ENCOUNTER — Telehealth: Payer: Self-pay | Admitting: Behavioral Health

## 2022-07-21 DIAGNOSIS — F902 Attention-deficit hyperactivity disorder, combined type: Secondary | ICD-10-CM

## 2022-07-21 MED ORDER — AMPHETAMINE-DEXTROAMPHET ER 10 MG PO CP24: 10.0000 mg | ORAL_CAPSULE | Freq: Every day | ORAL | 0 refills | Status: DC

## 2022-07-21 MED ORDER — MIRTAZAPINE 30 MG PO TABS: 30.0000 mg | ORAL_TABLET | Freq: Every day | ORAL | 3 refills | Status: DC

## 2022-07-21 NOTE — Telephone Encounter (Signed)
Updated pharmacy profile. Canceled Rx at [facility]. [facility].

## 2022-07-21 NOTE — Telephone Encounter (Signed)
Mom called to report new pharmacy due to insurance change is [facility] [place]. Please change to this pharmacy going forward. Canc 2 Rx sent to [facility] yesterday and [facility]zepine.

## 2022-08-24 ENCOUNTER — Telehealth: Payer: 59 | Admitting: Behavioral Health

## 2022-09-06 ENCOUNTER — Telehealth (INDEPENDENT_AMBULATORY_CARE_PROVIDER_SITE_OTHER): Payer: 59 | Admitting: Behavioral Health

## 2022-09-06 ENCOUNTER — Encounter: Payer: Self-pay | Admitting: Behavioral Health

## 2022-09-06 DIAGNOSIS — R634 Abnormal weight loss: Secondary | ICD-10-CM

## 2022-09-06 DIAGNOSIS — F411 Generalized anxiety disorder: Secondary | ICD-10-CM

## 2022-09-06 DIAGNOSIS — F902 Attention-deficit hyperactivity disorder, combined type: Secondary | ICD-10-CM | POA: Diagnosis not present

## 2022-09-06 NOTE — Progress Notes (Addendum)
FISHEL BIRKEY [id number] Apr 29, 2001 22 y.o.  Virtual Visit via Video Note  I connected with pt @ on 09/06/22 at 11:30 AM EST by a video enabled telemedicine application and verified that I am speaking with the correct person using two identifiers.   I discussed the limitations of evaluation and management by telemedicine and the availability of in person appointments. The patient expressed understanding and agreed to proceed.  I discussed the assessment and treatment plan with the patient. The patient was provided an opportunity to ask questions and all were answered. The patient agreed with the plan and demonstrated an understanding of the instructions.   The patient was advised to call back or seek an in-person evaluation if the symptoms worsen or if the condition fails to improve as anticipated.  I provided 20  minutes of non-face-to-face time during this encounter.  The patient was located at home.  The provider was located at [place].   Elwanda Brooklyn, NP   Subjective:   Patient ID:  Chris Salazar is a 22 y.o. (DOB [birth year]) male.  Chief Complaint: No chief complaint on file.   HPI  "Conar", 22  year old male presents to this office  via video visit for follow up and medication management.  Says that he is requesting slight increase of his Adderall. So far doing very well. Making better grades and says medication in helping.  Reports his anxiety today at  1/10, and his depression 1/10.  He is sleeping 7 hours plus per day.His weight has stabilized. Says has adjusted well at [facility]. He has roommate off campus. Says he feels like this is where he is supposed to be.  He says he has stopped dabbing cannabis in vape pen and only smokes the flower which is not as strong. Says he has cut back 50%. Only smokes at bedtime now.  No mania, no psychosis. No S/HI.    Past medication trials: Concerta 27 mg and Ritalin 10 mg R tablets with possibly Focalin 15 mg ER,  Buspar  Review of Systems:  Review of Systems  Constitutional: Negative.   Allergic/Immunologic: Negative.   Neurological: Negative.   Psychiatric/Behavioral:  The patient is nervous/anxious.     Medications: I have reviewed the patient's current medications.  Current Outpatient Medications  Medication Sig Dispense Refill   amphetamine-dextroamphetamine (ADDERALL XR) 10 MG 24 hr capsule Take 1 capsule (10 mg total) by mouth daily. 30 capsule 0   mirtazapine (REMERON) 30 MG tablet Take 1 tablet (30 mg total) by mouth at bedtime. 30 tablet 3   sertraline (ZOLOFT) 100 MG tablet Take 1 tablet (100 mg total) by mouth daily. (Patient not taking: Reported on 07/20/2022) 100 tablet 1   sertraline (ZOLOFT) 100 MG tablet Take 1.5 tablets (150 mg total) by mouth daily. (Patient not taking: Reported on 07/20/2022) 45 tablet 3   No current facility-administered medications for this visit.    Medication Side Effects: None  Allergies: No Known Allergies  Past Medical History:  Diagnosis Date   ADHD (attention deficit hyperactivity disorder)    Ankle fracture, left, closed, with routine healing, subsequent encounter    GERD (gastroesophageal reflux disease)    Headache    Obsessive-compulsive disorder     Family History  Problem Relation Age of Onset   ADD / ADHD Maternal Grandfather     Social History   Socioeconomic History   Marital status: Single    Spouse name: Not on file   Number  of children: Not on file   Years of education: 13   Highest education level: Some college, no degree  Occupational History   Occupation: Ship broker  Tobacco Use   Smoking status: Never   Smokeless tobacco: Never  Vaping Use   Vaping Use: Every day   Substances: Synthetic cannabinoids   Devices: delta 8  Substance and Sexual Activity   Alcohol use: Yes    Comment: social minimal   Drug use: Yes    Types: Other-see comments    Comment: delta 8 daily synthetic marijuana   Sexual activity: Not on  file  Other Topics Concern   Not on file  Social History Narrative   Saw Dr. Debbe Mounts for diagnosis of ADHD in the past which family describes as hyperactive impulsive with inattention so combined type in elementary then mostly inattention and inconsistency in middle school.  Only treatment has been Concerta 27 mg and Ritalin 10 mg R tablets with possibly Focalin 15 mg ER in the past.  Lives at home with mother and father. Taking courses at [employer]. Interested in future career in respiratory therapy.    Social Determinants of Health   Financial Resource Strain: Low Risk  (03/05/2019)   Overall Financial Resource Strain (CARDIA)    Difficulty of Paying Living Expenses: Not hard at all  Food Insecurity: No Food Insecurity (03/05/2019)   Hunger Vital Sign    Worried About Running Out of Food in the Last Year: Never true    Ran Out of Food in the Last Year: Never true  Transportation Needs: No Transportation Needs (03/05/2019)   PRAPARE - Hydrologist (Medical): No    Lack of Transportation (Non-Medical): No  Physical Activity: Not on file  Stress: Stress Concern Present (03/05/2019)   [place]    Feeling of Stress : To some extent  Social Connections: Not on file  Intimate Partner Violence: Not on file    Past Medical History, Surgical history, Social history, and Family history were reviewed and updated as appropriate.   Please see review of systems for further details on the patient's review from today.   Objective:   Physical Exam:  There were no vitals taken for this visit.  Physical Exam  Lab Review:  No results found for: "NA", "K", "CL", "CO2", "GLUCOSE", "BUN", "CREATININE", "CALCIUM", "PROT", "ALBUMIN", "AST", "ALT", "ALKPHOS", "BILITOT", "GFRNONAA", "GFRAA"  No results found for: "WBC", "RBC", "HGB", "HCT", "PLT", "MCV", "MCH", "MCHC", "RDW", "LYMPHSABS",  "MONOABS", "EOSABS", "BASOSABS"  No results found for: "POCLITH", "LITHIUM"   No results found for: "PHENYTOIN", "PHENOBARB", "VALPROATE", "CBMZ"   .res Assessment: Plan:   Greater than 50% of 20  min. video visit with patient was spent on counseling and coordination of care. Discussed his recent improvement with anxiety and depression.  His attention and focus has improved. He is very happy with his medication right now.    To increase Adderall  to 20 mg ER daily after breakfast To restart Mirtazapine 30 mg at bedtime daily To report any worsening symptoms and side effects promptly Agreed to follow up in 3 months to reassess. Will call sooner if problems arise. He will be living at [employer] attending college there. \ Requesting Vid visit next appt Discussed potential benefits, risks, and side effects of stimulants with patient to include increased heart rate, palpitations, insomnia, increased anxiety, increased irritability, or decreased appetite.  Instructed patient to contact office if  experiencing any significant tolerability issues.    Provided emergency contact information   Reviewed PDMP There are no diagnoses linked to this encounter.   Please see After Visit Summary for patient specific instructions.  Future Appointments  Date Time Provider [place]  12/06/2022 11:30 AM Lesle Chris A, NP CP-CP None    No orders of the defined types were placed in this encounter.     -------------------------------

## 2022-09-11 ENCOUNTER — Telehealth: Payer: Self-pay | Admitting: Behavioral Health

## 2022-09-11 NOTE — Telephone Encounter (Signed)
Mom said Adderall was supposed to be increased to 15 mg and sent to [facility] in [place].   Your 2/21 note:  To increase Adderall to 20 mg ER daily after breakfast.    I didn't pend due to discrepancy in dose.

## 2022-09-11 NOTE — Telephone Encounter (Signed)
Patient mom returned call stating that she called pharmacy and they do not have the name brand Adderall but do have generic. She would like a prescription that allows to get either generic or name brand. Ph: [facility]

## 2022-09-11 NOTE — Telephone Encounter (Signed)
Pt's mom called agaqin at 2:05p.  I read her the message that the note says '20mg'$ s.  She's going to confirm if that dose is available at the pharmacy instead of the '15mg'$ .  I told her Aaron Edelman will advise clinical if '20mg'$  is correct and if so, send the script in.  She will call us back if '20mg'$  it's NOT available at the pharmacy  Next appt 5/22

## 2022-09-11 NOTE — Telephone Encounter (Signed)
Mom, Izora Gala, called and LM at 11:06 that at the visit 2/22 Ameya's prescription for Adderall was going to be increase from 32mg to '15mg'$  and that [facility] [place] [facility] [place]  She was called to check the status of that refill.  Please send in the refill at the new dose.  Call her to let her the status. [phone]

## 2022-09-12 ENCOUNTER — Other Ambulatory Visit: Payer: Self-pay

## 2022-09-12 MED ORDER — AMPHETAMINE-DEXTROAMPHET ER 20 MG PO CP24: 20.0000 mg | ORAL_CAPSULE | Freq: Every day | ORAL | 0 refills | Status: DC

## 2022-09-12 NOTE — Telephone Encounter (Signed)
Pended.

## 2022-09-14 ENCOUNTER — Other Ambulatory Visit: Payer: Self-pay | Admitting: Behavioral Health

## 2022-09-14 DIAGNOSIS — F902 Attention-deficit hyperactivity disorder, combined type: Secondary | ICD-10-CM

## 2022-10-30 ENCOUNTER — Telehealth: Payer: Self-pay | Admitting: Behavioral Health

## 2022-10-30 NOTE — Telephone Encounter (Signed)
Mom, Harriett Sine, called to request refill of Rishith's Adderall, but she indicated that he is wanted to increase the dose to the next level.  He has appt 12/06/22.  I told her that the prescription would not be increase unless it was requested by the patient and there could be a conversation with him about the need.  She ask that [facility] be called if needed. But she the request for the fill be placed.  She could not verify the pharmacy had the medication.  [facility] [place], [place]

## 2022-11-01 ENCOUNTER — Other Ambulatory Visit: Payer: Self-pay

## 2022-11-01 MED ORDER — AMPHETAMINE-DEXTROAMPHET ER 20 MG PO CP24: 20.0000 mg | ORAL_CAPSULE | Freq: Every day | ORAL | 0 refills | Status: DC

## 2022-11-01 NOTE — Telephone Encounter (Signed)
Pt's mom called back at 2:41p.  [facility] [address] [facility] [place].  Next appt 5/22

## 2022-11-01 NOTE — Telephone Encounter (Signed)
Pended.

## 2022-12-06 ENCOUNTER — Ambulatory Visit (INDEPENDENT_AMBULATORY_CARE_PROVIDER_SITE_OTHER): Payer: 59 | Admitting: Behavioral Health

## 2022-12-06 ENCOUNTER — Encounter: Payer: Self-pay | Admitting: Behavioral Health

## 2022-12-06 DIAGNOSIS — F902 Attention-deficit hyperactivity disorder, combined type: Secondary | ICD-10-CM | POA: Diagnosis not present

## 2022-12-06 MED ORDER — MIRTAZAPINE 30 MG PO TABS
30.0000 mg | ORAL_TABLET | Freq: Every day | ORAL | 1 refills | Status: DC
Start: 2022-12-06 — End: 2023-07-03

## 2022-12-06 NOTE — Progress Notes (Signed)
[facility]  Patient ID: Chris Salazar,  MRN: [id number]  PCP: Mickie Bail, MD (Inactive)  Date of Evaluation: 12/06/2022 Time spent:20 minutes  Chief Complaint:  Chief Complaint   Depression; Anxiety; Medication Refill; Follow-up; Patient Education     HISTORY/CURRENT STATUS: HPI "Chris Salazar", 22  year old male presents to this office  for follow up and medication management.  Says he had a very good semester in school and medications are working well. He is home for the summer but will take some online classes. Reports his anxiety today at  1/10, and his depression 1/10.  He is sleeping 7 hours plus per day.His weight has stabilized. Says has adjusted well at [facility]. Says he continue to try to cut back on cannabis use.  No mania, no psychosis. No S/HI.  Requesting no med changes this visit.   Past medication trials: Concerta 27 mg and Ritalin 10 mg R tablets with possibly Focalin 15 mg ER, Buspar Individual Medical History/ Review of Systems: Changes? :No   Allergies: Patient has no known allergies.  Current Medications:  Current Outpatient Medications:    amphetamine-dextroamphetamine (ADDERALL XR) 20 MG 24 hr capsule, Take 1 capsule (20 mg total) by mouth daily., Disp: 30 capsule, Rfl: 0   mirtazapine (REMERON) 30 MG tablet, Take 1 tablet (30 mg total) by mouth at bedtime., Disp: 90 tablet, Rfl: 1   sertraline (ZOLOFT) 100 MG tablet, Take 1 tablet (100 mg total) by mouth daily. (Patient not taking: Reported on 07/20/2022), Disp: 100 tablet, Rfl: 1   sertraline (ZOLOFT) 100 MG tablet, Take 1.5 tablets (150 mg total) by mouth daily. (Patient not taking: Reported on 07/20/2022), Disp: 45 tablet, Rfl: 3 Medication Side Effects: none  Family Medical/ Social History: Changes? No  MENTAL HEALTH EXAM:  There were no vitals taken for this visit.There is no height or weight on file to calculate BMI.  General Appearance: Casual, Neat, and Well Groomed  Eye Contact:   Good  Speech:  Clear and Coherent  Volume:  Normal  Mood:  Anxious, Depressed, and Dysphoric  Affect:  Appropriate  Thought Process:  Coherent  Orientation:  Full (Time, Place, and Person)  Thought Content: Logical   Suicidal Thoughts:  No  Homicidal Thoughts:  No  Memory:  WNL  Judgement:  Good  Insight:  Good  Psychomotor Activity:  Normal  Concentration:  Concentration: Good  Recall:  Good  Fund of Knowledge: Good  Language: Good  Assets:  Desire for Improvement  ADL's:  Intact  Cognition: WNL  Prognosis:  Good    DIAGNOSES:    ICD-10-CM   1. Attention deficit hyperactivity disorder (ADHD), combined type, moderate  F90.2 mirtazapine (REMERON) 30 MG tablet      Receiving Psychotherapy: No    RECOMMENDATIONS:   Greater than 50% of 20  min. video visit with patient was spent on counseling and coordination of care. Discussed his recent improvement with anxiety and depression.  His attention and focus has improved. He is very happy with his medication right now.    To continue Adderall  to 20 mg ER daily after breakfast To continue Mirtazapine 30 mg at bedtime daily To report any worsening symptoms and side effects promptly Agreed to follow up in 3 months to reassess. Will call sooner if problems arise. He will be living at [employer] attending college there. \ Requesting Vid visit next appt Discussed potential benefits, risks, and side effects of stimulants with patient to include increased  heart rate, palpitations, insomnia, increased anxiety, increased irritability, or decreased appetite.  Instructed patient to contact office if experiencing any significant tolerability issues.    Provided emergency contact information   Reviewed PDMP        Joan Flores, NP

## 2022-12-26 ENCOUNTER — Telehealth: Payer: Self-pay | Admitting: Behavioral Health

## 2022-12-26 ENCOUNTER — Other Ambulatory Visit: Payer: Self-pay | Admitting: Behavioral Health

## 2022-12-26 DIAGNOSIS — F902 Attention-deficit hyperactivity disorder, combined type: Secondary | ICD-10-CM

## 2022-12-26 MED ORDER — AMPHETAMINE-DEXTROAMPHET ER 20 MG PO CP24: 20.0000 mg | ORAL_CAPSULE | Freq: Every day | ORAL | 0 refills | Status: DC

## 2022-12-26 NOTE — Telephone Encounter (Signed)
Mom called asking for a refill on Chris Salazar's adderall  xr 20 mg. [facility] [address] [facility] [place]

## 2023-02-28 ENCOUNTER — Other Ambulatory Visit: Payer: Self-pay

## 2023-02-28 ENCOUNTER — Telehealth: Payer: Self-pay | Admitting: Behavioral Health

## 2023-02-28 DIAGNOSIS — F902 Attention-deficit hyperactivity disorder, combined type: Secondary | ICD-10-CM

## 2023-02-28 MED ORDER — AMPHETAMINE-DEXTROAMPHET ER 20 MG PO CP24
20.0000 mg | ORAL_CAPSULE | Freq: Every day | ORAL | 0 refills | Status: AC
Start: 2023-02-28 — End: ?

## 2023-02-28 NOTE — Telephone Encounter (Signed)
Mom lvm that Chris Salazar needs a refill on his adderall xr 20 mg. [facility] [address] [facility] [address] in [place]

## 2023-02-28 NOTE — Telephone Encounter (Signed)
Pended.

## 2023-03-08 ENCOUNTER — Telehealth (INDEPENDENT_AMBULATORY_CARE_PROVIDER_SITE_OTHER): Payer: Self-pay | Admitting: Behavioral Health

## 2023-03-08 DIAGNOSIS — Z0389 Encounter for observation for other suspected diseases and conditions ruled out: Secondary | ICD-10-CM

## 2023-03-08 NOTE — Progress Notes (Signed)
Waited 15 min and pt did not log in to my chart for appointment. Attempted call to listed phone number and no way to leave message.  Additional fees to be assessed.

## 2023-07-03 ENCOUNTER — Encounter: Payer: Self-pay | Admitting: Behavioral Health

## 2023-07-03 ENCOUNTER — Ambulatory Visit: Payer: 59 | Admitting: Behavioral Health

## 2023-07-03 DIAGNOSIS — F902 Attention-deficit hyperactivity disorder, combined type: Secondary | ICD-10-CM | POA: Diagnosis not present

## 2023-07-03 MED ORDER — AMPHETAMINE-DEXTROAMPHET ER 25 MG PO CP24
25.0000 mg | ORAL_CAPSULE | ORAL | 0 refills | Status: AC
Start: 2023-07-03 — End: ?

## 2023-07-03 MED ORDER — MIRTAZAPINE 30 MG PO TABS
30.0000 mg | ORAL_TABLET | Freq: Every day | ORAL | 1 refills | Status: AC
Start: 2023-07-03 — End: ?

## 2023-07-03 NOTE — Progress Notes (Signed)
[facility]  Patient ID: LEELAND BARB,  MRN: [id number]  PCP: Mickie Bail, MD (Inactive)  Date of Evaluation: 07/03/2023 Time spent:30 minutes  Chief Complaint:  Chief Complaint   Anxiety; Depression; Follow-up; Patient Education; Medication Refill     HISTORY/CURRENT STATUS: HPI  "Chris Salazar", 22  year old male presents to this office  for follow up and medication management.  Good semester in school. Home for the holidays. Will finish up next summer. Would like to consider small increase in Adderall to help with his studies. Feeling some decline in late afternoon.  Reports his anxiety today at  1/10, and his depression 1/10.  He is sleeping 7 hours plus per day.His weight has stabilized. Says has adjusted well at [facility] Says he continue to try to cut back on cannabis use.  No mania, no psychosis. No S/HI.  Requesting no med changes this visit.   Past medication trials: Concerta 27 mg and Ritalin 10 mg R tablets with possibly Focalin 15 mg ER, Buspar    Individual Medical History/ Review of Systems: Changes? :No   Allergies: Patient has no known allergies.  Current Medications:  Current Outpatient Medications:    amphetamine-dextroamphetamine (ADDERALL XR) 25 MG 24 hr capsule, Take 1 capsule by mouth every morning., Disp: 30 capsule, Rfl: 0   amphetamine-dextroamphetamine (ADDERALL XR) 20 MG 24 hr capsule, Take 1 capsule (20 mg total) by mouth daily., Disp: 30 capsule, Rfl: 0   mirtazapine (REMERON) 30 MG tablet, Take 1 tablet (30 mg total) by mouth at bedtime., Disp: 90 tablet, Rfl: 1   sertraline (ZOLOFT) 100 MG tablet, Take 1 tablet (100 mg total) by mouth daily. (Patient not taking: Reported on 07/20/2022), Disp: 100 tablet, Rfl: 1   sertraline (ZOLOFT) 100 MG tablet, Take 1.5 tablets (150 mg total) by mouth daily. (Patient not taking: Reported on 07/20/2022), Disp: 45 tablet, Rfl: 3 Medication Side Effects: none  Family Medical/ Social History: Changes?  No  MENTAL HEALTH EXAM:  There were no vitals taken for this visit.There is no height or weight on file to calculate BMI.  General Appearance: Casual, Neat, and Well Groomed  Eye Contact:  Good  Speech:  Clear and Coherent  Volume:  Normal  Mood:  NA  Affect:  Appropriate  Thought Process:  Coherent  Orientation:  Full (Time, Place, and Person)  Thought Content: Logical   Suicidal Thoughts:  No  Homicidal Thoughts:  No  Memory:  WNL  Judgement:  Good  Insight:  Good  Psychomotor Activity:  Normal  Concentration:  Concentration: Good  Recall:  Good  Fund of Knowledge: Good  Language: Good  Assets:  Desire for Improvement  ADL's:  Intact  Cognition: WNL  Prognosis:  Good    DIAGNOSES:    ICD-10-CM   1. Attention deficit hyperactivity disorder (ADHD), combined type, moderate  F90.2 amphetamine-dextroamphetamine (ADDERALL XR) 25 MG 24 hr capsule    mirtazapine (REMERON) 30 MG tablet      Receiving Psychotherapy: No    RECOMMENDATIONS:   Greater than 50% of 30  min. video visit with patient was spent on counseling and coordination of care. Discussed his recent improvement with anxiety and depression.  His attention and focus has improved. He is very happy with his medication right now.    To increase Adderall  to 25 mg ER daily after breakfast To continue Mirtazapine 30 mg at bedtime daily To report any worsening symptoms and side effects promptly Agreed to follow up  in 3 months to reassess. Will call sooner if problems arise. He will be living at [employer] attending college there. \ Requesting Vid visit next appt Discussed potential benefits, risks, and side effects of stimulants with patient to include increased heart rate, palpitations, insomnia, increased anxiety, increased irritability, or decreased appetite.  Instructed patient to contact office if experiencing any significant tolerability issues.    Provided emergency contact information Reviewed  PDMP     Joan Flores, NP

## 2023-12-11 ENCOUNTER — Ambulatory Visit (INDEPENDENT_AMBULATORY_CARE_PROVIDER_SITE_OTHER): Payer: 59 | Admitting: Behavioral Health

## 2023-12-11 DIAGNOSIS — Z0389 Encounter for observation for other suspected diseases and conditions ruled out: Secondary | ICD-10-CM

## 2023-12-11 NOTE — Progress Notes (Signed)
Pt did not show for scheduled appt and did not provide 24 hours notice as required. Additional fees to be assessed.
